# Patient Record
Sex: Female | Born: 1970 | Race: White | Hispanic: No | Marital: Married | Smoking: Never smoker
Health system: Southern US, Community
[De-identification: ages and names within clinical notes are randomized; demographics above are authoritative.]

## PROBLEM LIST (undated history)

## (undated) HISTORY — PX: HAND SURGERY: SHX662

---

## 2000-03-29 ENCOUNTER — Ambulatory Visit (HOSPITAL_COMMUNITY): Admission: RE | Admit: 2000-03-29 | Discharge: 2000-03-29 | Payer: Self-pay | Admitting: Obstetrics and Gynecology

## 2000-03-29 ENCOUNTER — Encounter: Payer: Self-pay | Admitting: Obstetrics and Gynecology

## 2002-04-25 ENCOUNTER — Other Ambulatory Visit: Admission: RE | Admit: 2002-04-25 | Discharge: 2002-04-25 | Payer: Self-pay | Admitting: Obstetrics and Gynecology

## 2003-07-04 ENCOUNTER — Other Ambulatory Visit: Admission: RE | Admit: 2003-07-04 | Discharge: 2003-07-04 | Payer: Self-pay | Admitting: Obstetrics and Gynecology

## 2004-07-07 ENCOUNTER — Other Ambulatory Visit: Admission: RE | Admit: 2004-07-07 | Discharge: 2004-07-07 | Payer: Self-pay | Admitting: Obstetrics and Gynecology

## 2004-12-24 ENCOUNTER — Other Ambulatory Visit: Admission: RE | Admit: 2004-12-24 | Discharge: 2004-12-24 | Payer: Self-pay | Admitting: Obstetrics and Gynecology

## 2006-02-28 ENCOUNTER — Other Ambulatory Visit: Admission: RE | Admit: 2006-02-28 | Discharge: 2006-02-28 | Payer: Self-pay | Admitting: Obstetrics and Gynecology

## 2006-08-09 ENCOUNTER — Emergency Department (HOSPITAL_COMMUNITY): Admission: EM | Admit: 2006-08-09 | Discharge: 2006-08-09 | Payer: Self-pay | Admitting: Family Medicine

## 2010-09-13 ENCOUNTER — Emergency Department (HOSPITAL_COMMUNITY): Payer: No Typology Code available for payment source

## 2010-09-13 ENCOUNTER — Emergency Department (HOSPITAL_COMMUNITY)
Admission: EM | Admit: 2010-09-13 | Discharge: 2010-09-13 | Disposition: A | Discharge status: Home / Self Care | Payer: No Typology Code available for payment source | Attending: Emergency Medicine | Admitting: Emergency Medicine

## 2010-09-13 DIAGNOSIS — M25519 Pain in unspecified shoulder: Secondary | ICD-10-CM | POA: Insufficient documentation

## 2010-09-13 DIAGNOSIS — IMO0002 Reserved for concepts with insufficient information to code with codable children: Secondary | ICD-10-CM | POA: Insufficient documentation

## 2010-09-13 LAB — URINALYSIS, ROUTINE W REFLEX MICROSCOPIC
Bilirubin Urine: NEGATIVE
Glucose, UA: NEGATIVE mg/dL
Ketones, ur: NEGATIVE mg/dL
Leukocytes, UA: NEGATIVE
Nitrite: NEGATIVE
Protein, ur: 30 mg/dL — AB
Specific Gravity, Urine: 1.023 (ref 1.005–1.030)
Urobilinogen, UA: 0.2 mg/dL (ref 0.0–1.0)
pH: 5.5 (ref 5.0–8.0)

## 2010-09-13 LAB — URINE MICROSCOPIC-ADD ON

## 2010-09-24 ENCOUNTER — Emergency Department (HOSPITAL_COMMUNITY)
Admission: EM | Admit: 2010-09-24 | Discharge: 2010-09-25 | Disposition: A | Discharge status: Home / Self Care | Payer: Self-pay | Source: Home / Self Care | Attending: Emergency Medicine | Admitting: Emergency Medicine

## 2010-09-24 DIAGNOSIS — Y92009 Unspecified place in unspecified non-institutional (private) residence as the place of occurrence of the external cause: Secondary | ICD-10-CM | POA: Insufficient documentation

## 2010-09-24 DIAGNOSIS — W540XXA Bitten by dog, initial encounter: Secondary | ICD-10-CM | POA: Insufficient documentation

## 2010-09-24 DIAGNOSIS — L02519 Cutaneous abscess of unspecified hand: Secondary | ICD-10-CM | POA: Insufficient documentation

## 2010-09-24 DIAGNOSIS — Y998 Other external cause status: Secondary | ICD-10-CM | POA: Insufficient documentation

## 2010-09-24 DIAGNOSIS — Z79899 Other long term (current) drug therapy: Secondary | ICD-10-CM | POA: Insufficient documentation

## 2010-09-24 DIAGNOSIS — L03019 Cellulitis of unspecified finger: Secondary | ICD-10-CM | POA: Insufficient documentation

## 2010-09-24 DIAGNOSIS — S61209A Unspecified open wound of unspecified finger without damage to nail, initial encounter: Secondary | ICD-10-CM | POA: Insufficient documentation

## 2010-09-24 LAB — DIFFERENTIAL
Basophils Relative: 0 % (ref 0–1)
Lymphocytes Relative: 29 % (ref 12–46)
Monocytes Absolute: 0.9 10*3/uL (ref 0.1–1.0)
Monocytes Relative: 7 % (ref 3–12)
Neutro Abs: 7.4 10*3/uL (ref 1.7–7.7)

## 2010-09-24 LAB — CBC
HCT: 41.5 % (ref 36.0–46.0)
Hemoglobin: 13.5 g/dL (ref 12.0–15.0)
MCH: 29.2 pg (ref 26.0–34.0)
MCHC: 32.5 g/dL (ref 30.0–36.0)
MCV: 89.8 fL (ref 78.0–100.0)

## 2010-09-25 ENCOUNTER — Inpatient Hospital Stay (HOSPITAL_COMMUNITY)
Admission: EM | Admit: 2010-09-25 | Discharge: 2010-09-27 | DRG: 506 | Disposition: A | Discharge status: Home / Self Care | Payer: No Typology Code available for payment source | Source: Ambulatory Visit | Attending: Orthopedic Surgery | Admitting: Orthopedic Surgery

## 2010-09-25 ENCOUNTER — Emergency Department (HOSPITAL_COMMUNITY): Payer: No Typology Code available for payment source

## 2010-09-25 DIAGNOSIS — W540XXA Bitten by dog, initial encounter: Secondary | ICD-10-CM | POA: Diagnosis present

## 2010-09-25 DIAGNOSIS — Z79899 Other long term (current) drug therapy: Secondary | ICD-10-CM

## 2010-09-25 DIAGNOSIS — M65839 Other synovitis and tenosynovitis, unspecified forearm: Principal | ICD-10-CM | POA: Diagnosis present

## 2010-09-25 DIAGNOSIS — S61209A Unspecified open wound of unspecified finger without damage to nail, initial encounter: Secondary | ICD-10-CM | POA: Diagnosis present

## 2010-09-25 LAB — GRAM STAIN

## 2010-09-26 LAB — CBC
MCHC: 32.4 g/dL (ref 30.0–36.0)
MCV: 88.9 fL (ref 78.0–100.0)
Platelets: 285 10*3/uL (ref 150–400)
RDW: 13.6 % (ref 11.5–15.5)
WBC: 15.2 10*3/uL — ABNORMAL HIGH (ref 4.0–10.5)

## 2010-09-26 LAB — BASIC METABOLIC PANEL
BUN: 10 mg/dL (ref 6–23)
Chloride: 107 mEq/L (ref 96–112)
Creatinine, Ser: 0.53 mg/dL (ref 0.4–1.2)
GFR calc non Af Amer: >60 mL/min (ref >60)
Glucose, Bld: 145 mg/dL — ABNORMAL HIGH (ref 70–99)

## 2010-09-27 LAB — CBC
HCT: 35.8 % — ABNORMAL LOW (ref 36.0–46.0)
MCHC: 31.8 g/dL (ref 30.0–36.0)
Platelets: 280 10*3/uL (ref 150–400)
RDW: 14.1 % (ref 11.5–15.5)
WBC: 11.5 10*3/uL — ABNORMAL HIGH (ref 4.0–10.5)

## 2010-09-28 LAB — CULTURE, ROUTINE-ABSCESS: Gram Stain: NONE SEEN

## 2010-09-30 LAB — ANAEROBIC CULTURE
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
## 2010-10-08 NOTE — Op Note (Signed)
NAME:  Nancy Mayo, Nancy Mayo NO.:  [id number]  MEDICAL RECORD NO.:  [id number]           PATIENT TYPE:  E  LOCATION:  [facility]                         FACILITY:  [facility]  PHYSICIAN:  Betha Loa, MD        DATE OF BIRTH:  [birth year]  DATE OF PROCEDURE:  09/25/2010 DATE OF DISCHARGE:                              OPERATIVE REPORT   PREOPERATIVE DIAGNOSES:  Left index finger infected dog bites and flexor tenosynovitis.  POSTOPERATIVE DIAGNOSIS:  Left index finger infected dog bites and flexor tenosynovitis.  PROCEDURE:  Irrigation and debridement of left index finger dog bite, flexor tendon sheath and distal interphalangeal joint.  SURGEON:  Betha Loa, MD  ASSISTANT:  None.  ANESTHESIA:  General.  INTRAVENOUS FLUIDS:  Per anesthesia flow sheet.  ESTIMATED BLOOD LOSS:  Minimal.  COMPLICATIONS:  None.  SPECIMENS:  Cultures from bite wounds and flexor tendon sheath.  TOURNIQUET TIME:  59 minutes.  DISPOSITION:  Stable to PACU.  INDICATIONS:  Ms. Blake is a 40 year old right-hand-dominant white female who was bitten by her own dog a day and half ago.  She noted increased swelling, pain, and erythema of the left index finger.  She went to the [facility] Emergency Department where she was evaluated. She was felt to have an infection of the index finger.  She was transferred to [facility] for my further care.  She was given a dose of IV clindamycin at [facility].  Her tetanus was updated at [facility].  On evaluation, she had a swollen and tender left index finger with tenderness volarly and pain with passive extension.  Recommended to Ms. Falkner going to the operating room for irrigation and debridement of bite wounds in the flexor tendon sheath.  Risks, benefits, and alternatives of surgery were discussed including the risks of blood loss, infection, damage to nerves, vessels, tendons, ligaments, and bone, failure of surgery, need for additional surgery,  complications with wound healing, continued pain, and potential need for repeat irrigation and debridement.  She voiced understanding these risks and elected to proceed.  OPERATIVE COURSE:  After being identified preoperatively by myself, the patient and I agreed upon the procedure and site of procedure.  Surgical site was marked.  Risks, benefits, and alternatives of surgery were reviewed and she wished to proceed.  Surgical consent had been signed. She was transferred to the operating room and placed on the operating table in supine position with left upper extremity on an arm board. Anesthesia was induced by the anesthesiologist.  The left upper extremity was prepped and draped in the normal sterile orthopedic fashion.  A surgical pause was performed between surgeons, Anesthesia, and operating staff and all were in agreement as to the patient, procedure, and site of procedure.  Tourniquet on the proximal aspect of the extremity was inflated to 250 mmHg after exsanguination of limb by gravity.  Incisions were made at the bite wounds on the radial and ulnar sides of the proximal phalanx of the index finger.  On the radial side, there were 2 bite wounds that were connected.  There was purulence expressed.  Cultures were  taken and sent to Micro for evaluation.  The wound on the ulnar side was extended.  It was able to be spread open into the subcutaneous tissues of the volar aspect of the proximal phalanx.  An incision was made in the palm at the level of the A1 pulley.  The A1 pulley was incised.  There was purulence within the flexor tendon sheath.  This was sent for cultures as well.  An incision was made over the distal phalanx volarly.  A #5 pediatric feeding tube was able to be threaded into the flexor tendon sheath from distally. 250 mL of sterile saline were irrigated through the flexor tendon sheath by the pediatric feeding tube.  Good effluent was obtained from both proximal  and distal wounds.  An incision was made over the bite wound at the level of the DIP joint on the radial side of the finger.  This was carried into the subcutaneous tissues.  The DIP joint was entered. There was noted to be no purulent fluid within it.  The joint was irrigated.  A vessel loop drain was placed in the DIP joint.  All wounds were then packed with 0.25-inch iodoform packing.  The wounds had all then copiously irrigated with 1000 mL of sterile saline prior to packing.  The wounds were then dressed with sterile Xeroform and 4 x 4's and wrapped with a Kerlix bandage.  A dorsal blocking splint was placed for the patient's comfort.  This was wrapped with Kerlix and Ace bandage.  Tourniquet was deflated at 59 minutes.  All fingertips were pink with brisk capillary refill after deflation of the third.  The operative drapes were broken down and the patient was awoken from anesthesia safely.  She was transferred back to the stretcher and taken to PACU in stable condition.  We will keep her admitted for IV antibiotics.  She got a dose of IV Avelox 400 mg during the operation. We will continue her on this Avelox 400 mg IV daily as well as Flagyl 500 mg p.o. q.8 h.  As her white count and erythema decrease, we will be able to discharge her home and follow up in the office for whirlpool therapy.     Betha Loa, MD     KK/MEDQ  D:  09/25/2010  T:  09/25/2010  Job:  [id number]  Electronically Signed by Betha Loa  on 10/08/2010 10:49:29 PM

## 2010-10-08 NOTE — Discharge Summary (Signed)
  NAME:  Nancy Mayo, BATTLE NO.:  [id number]  MEDICAL RECORD NO.:  [id number]           PATIENT TYPE:  O  LOCATION:  5028                         FACILITY:  [facility]  PHYSICIAN:  Betha Loa, MD        DATE OF BIRTH:  04/12/1971  DATE OF ADMISSION:  09/25/2010 DATE OF DISCHARGE:  09/27/2010                              DISCHARGE SUMMARY   FINAL DIAGNOSIS:  Left index finger flexor tenosynovitis and subcutaneous abscess.  PROCEDURE:  Left index finger irrigation and debridement flexor tendon sheath, distal interphalangeal joint and subcutaneous tissues.  HISTORY:  [name] is a 40 year old right-hand dominant white female was bitten by her own dog day and half prior to admission.  She had increased swelling, pain, and erythema in the left index finger.  She went to [facility] emergency department where she was evaluated.  She was found to have an infection in the index finger.  She was transferred to [facility] for further care.  She was given a dose of IV clindamycin at [facility].  Tetanus was updated at [facility].  On evaluation, she has swollen and tender left index finger with tenderness volarly and pain with passive extension.  I recommended to Ms. Dimiceli going to the operating room for irrigation and debridement of the left index finger. Risks, benefits and alternatives of surgery were discussed including risk of blood loss, infection, damage to nerves, vessels, tendons, ligaments, bone, failure of procedure, need for additional procedures, complications with wound healing, continued pain, continued infection and stiffness of the finger.  She wished understanding of these risks and elected to proceed.  Her white count was checked and it was 12.1.  HOSPITAL COURSE:  [name] was admitted to hospital early morning hours of Sep 25, 2010.  She was taken to the operating room for irrigation and debridement of left index finger flexor tendon sheath, DIP  joint and subcutaneous tissues of the proximal phalanx.  This was performed without complication.  She was started on IV Avelox and oral Flagyl as she was penicillin and amoxicillin allergic.  She was admitted to the hospital for continued antibiotics.  Followup her white blood count initially went up to 15.2 the first day following her procedure, but then was trending down and was 11.5 on Sep 29, 2010.  The erythema and swelling in the hand had nearly resolved and the tenderness had significantly decreased.  Her drains were removed and was felt to be safe to go home on oral antibiotics.  I will follow up with her this week for continued postoperative care and hydrotherapy with packing changes.  MEDICATIONS ON DISCHARGE: 1. Avelox 400 mg p.o. daily. 2. Flagyl 500 mg p.o. q.8 hours. 3. Percocet 5/325 one to two p.o. q.6 h. p.r.n. pain dispensed #50. 4. Zofran 4 mg p.o. q.8 h. p.r.n. nausea, dispensed #20.     Betha Loa, MD     KK/MEDQ  D:  09/27/2010  T:  09/28/2010  Job:  [id number]  Electronically Signed by Betha Loa  on 10/08/2010 10:46:34 PM

## 2010-11-07 LAB — AFB CULTURE WITH SMEAR (NOT AT ARMC): Acid Fast Smear: NONE SEEN
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
## 2012-04-30 IMAGING — CR DG HAND COMPLETE 3+V*L*
3 series · 3 of 3 positions shown · non-contrast
Comparison: None

CLINICAL DATA: Dog bite to the second digit with pain, redness, and
swelling.

LEFT HAND - COMPLETE 3+ VIEW

[view not recorded (1 of 3)]
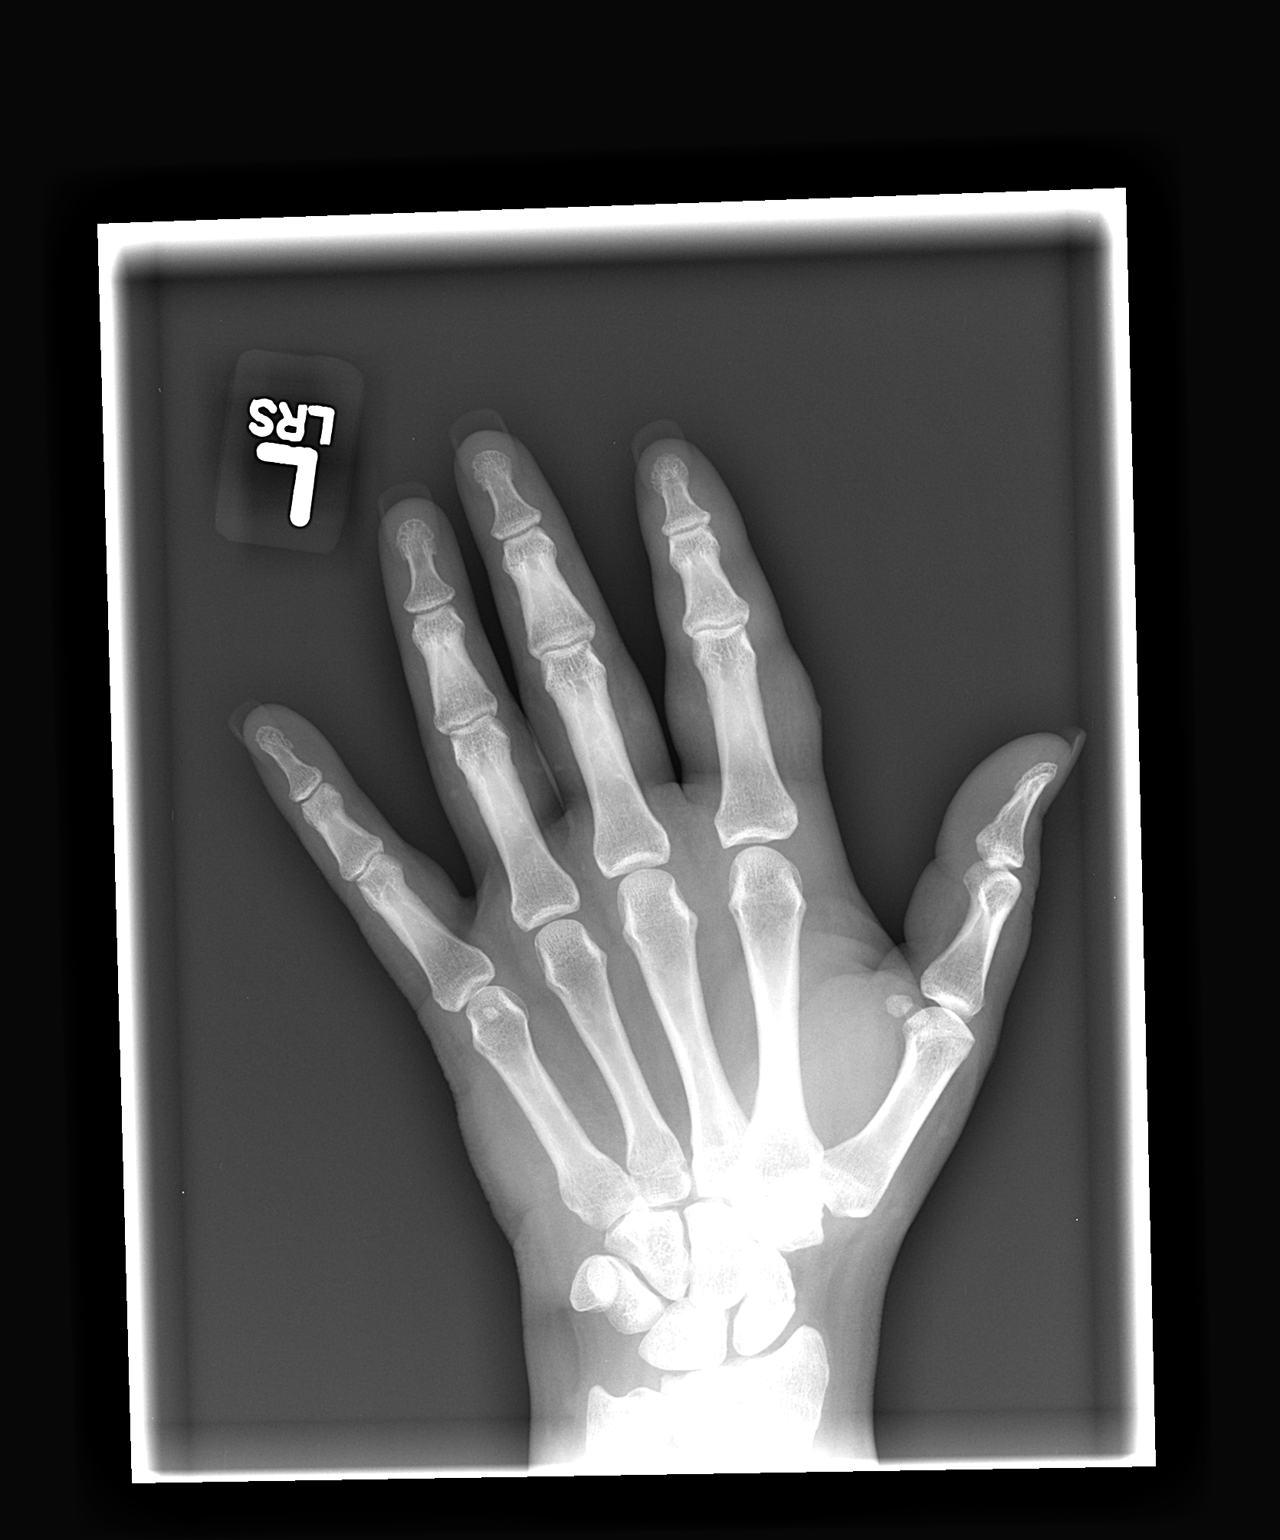

[view not recorded (2 of 3)]
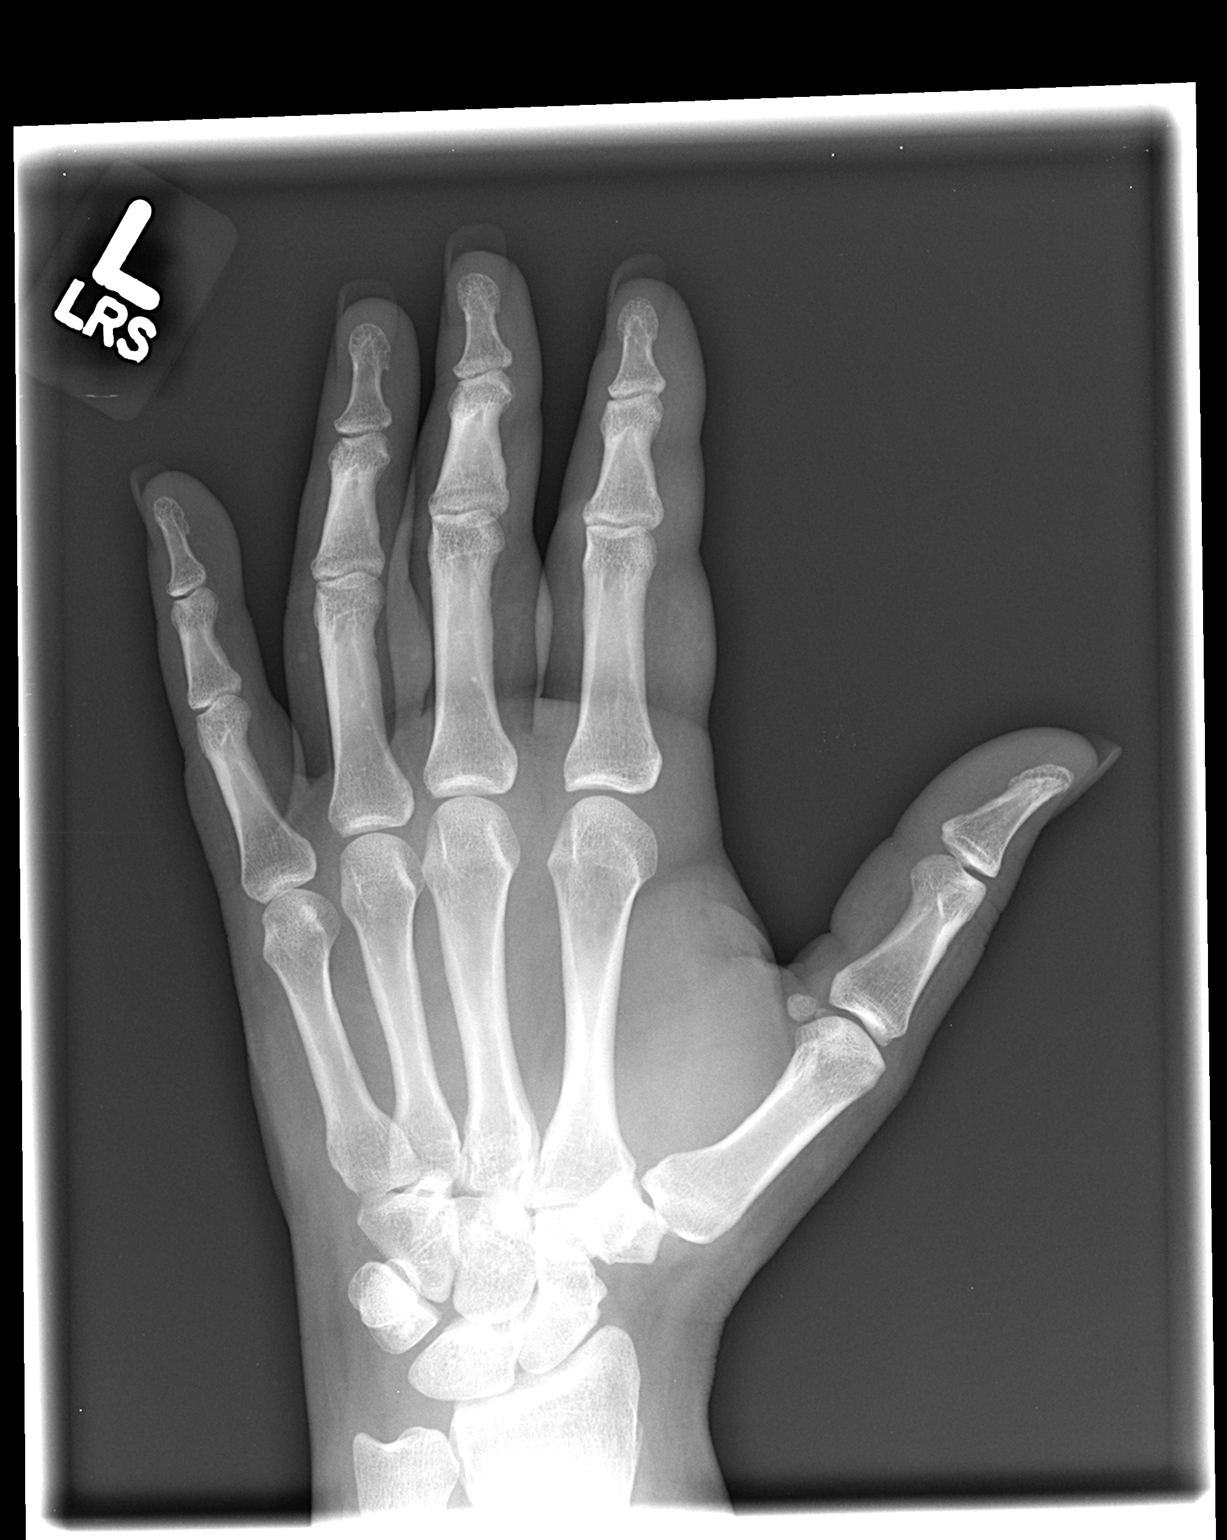

[view not recorded (3 of 3)]
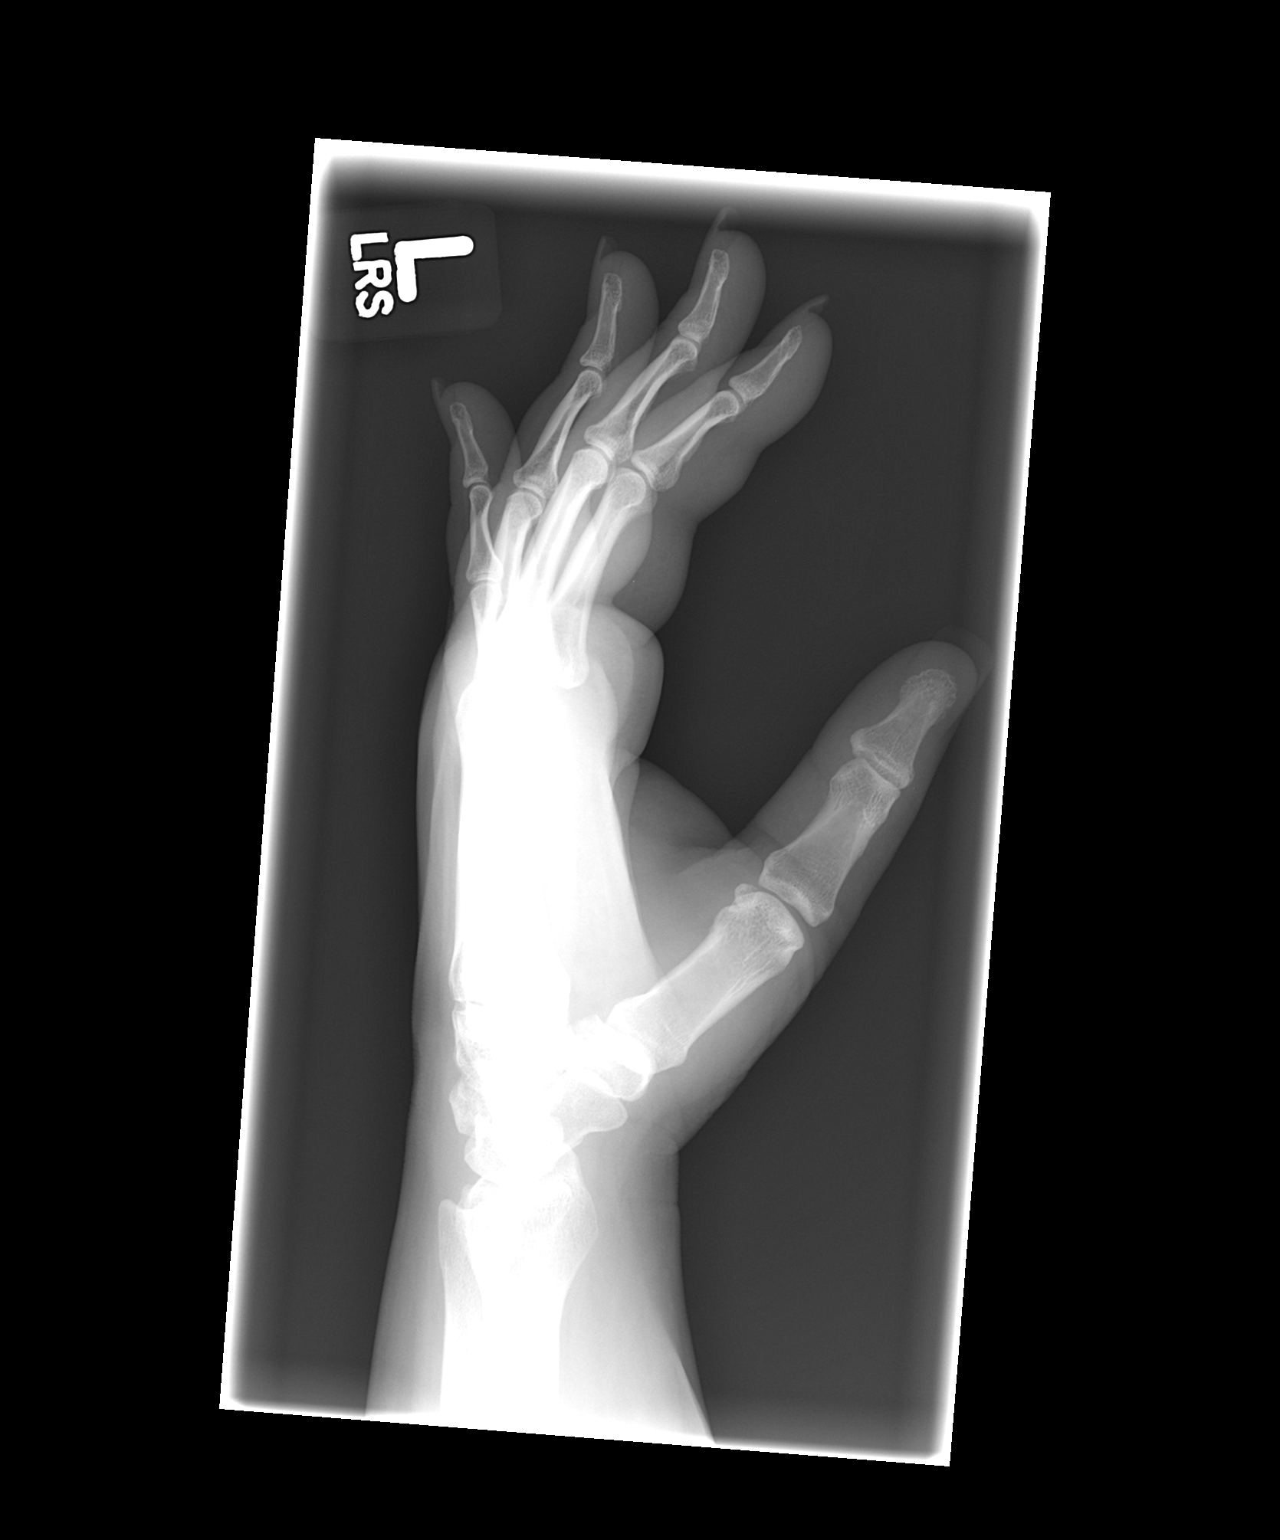

[3 of 3 positions shown; findings below may reference images not displayed]

FINDINGS: Next fever there is some soft tissue swelling over the
proximal aspect of the left second finger.  Suggestion of small
skin defect laterally.  No radiopaque foreign bodies are
demonstrated in the soft tissues.  The underlying bones appear
intact.  No evidence of fracture, sclerosis, periosteal reaction,
or erosion.
IMPRESSION: Soft tissue swelling.  No radiopaque foreign bodies.  No acute bony
abnormalities.

## 2013-02-16 ENCOUNTER — Other Ambulatory Visit: Payer: Self-pay

## 2013-02-16 DIAGNOSIS — Z1231 Encounter for screening mammogram for malignant neoplasm of breast: Secondary | ICD-10-CM

## 2013-02-21 ENCOUNTER — Ambulatory Visit: Payer: Self-pay

## 2013-02-26 ENCOUNTER — Ambulatory Visit
Admission: RE | Admit: 2013-02-26 | Discharge: 2013-02-26 | Disposition: A | Discharge status: Home / Self Care | Payer: Self-pay | Source: Ambulatory Visit

## 2013-02-26 DIAGNOSIS — Z1231 Encounter for screening mammogram for malignant neoplasm of breast: Secondary | ICD-10-CM

## 2014-09-24 ENCOUNTER — Other Ambulatory Visit: Payer: Self-pay | Admitting: *Deleted

## 2014-09-24 DIAGNOSIS — M792 Neuralgia and neuritis, unspecified: Secondary | ICD-10-CM

## 2014-10-10 ENCOUNTER — Ambulatory Visit (INDEPENDENT_AMBULATORY_CARE_PROVIDER_SITE_OTHER): Payer: 59 | Admitting: Neurology

## 2014-10-10 DIAGNOSIS — M792 Neuralgia and neuritis, unspecified: Secondary | ICD-10-CM | POA: Diagnosis not present

## 2014-10-10 DIAGNOSIS — G5601 Carpal tunnel syndrome, right upper limb: Secondary | ICD-10-CM

## 2014-10-10 DIAGNOSIS — G5621 Lesion of ulnar nerve, right upper limb: Secondary | ICD-10-CM

## 2014-10-10 NOTE — Procedures (Signed)
[facility]  [address], [address]  [place], [place] [address] Tel: [phone] Fax:  [phone] Test Date:  10/10/2014  Patient: Nancy PouLaura Mayo DOB: 08/10/1970 Physician: Nita Sickleonika Patel  Sex: Female Height: 5\' 6"  Ref Phys: Lupe CarneyMitchell, Dean  ID#: [id number] Temp: 38.0C Technician: Ala BentSusan Reid R. NCS T.   Patient Complaints: Patient is a 44 year old female here for evaluation of her right upper extremity due to numbness and tingling in digits 4 and 5.  NCV & EMG Findings: Extensive electrodiagnostic testing of the right upper extremity shows:  1. Right median sensory response is prolonged (3.8 ms) with preserved amplitude. Right ulnar, radial, and dorsal ulnar cutaneous sensory responses are within normal limits. 2. Right ulnar motor response (FDI) shows normal amplitude and latency, however there is absolute conduction velocity slowing across the elbow. Right median motor responses within normal limits. 3. Sparse active and chronic motor axon loss changes isolated to the first dorsal interosseous muscle. The remaining tested muscles showed normal motor unit configuration and recruitment pattern.  Impression: 1. 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 Overall, these findings are mild in degree electrically. 2. Right median neuropathy at or distal to the wrist, consistent with the clinical diagnosis of carpal tunnel syndrome; mild in degree electrically.    ___________________________ Nita Sickleonika Patel    Nerve Conduction Studies Anti Sensory Summary Table   Stim Site NR Peak (ms) Norm Peak (ms) P-T Amp (V) Norm P-T Amp  Right DorsCutan Anti Sensory (Dorsum 5th MC)  38C  Wrist    1.9 <3.1 18.3 >12  Right Median Anti Sensory (2nd Digit)  38C  Wrist    3.8 <3.4 27.2 >20  Right Radial Anti Sensory (Base 1st Digit)  38C  Wrist    1.9 <2.7 40.3 >18  Right Ulnar Anti Sensory (5th Digit)  38C  Wrist    2.8 <3.1 19.6 >12   Motor  Summary Table   Stim Site NR Onset (ms) Norm Onset (ms) O-P Amp (mV) Norm O-P Amp Site1 Site2 Delta-0 (ms) Dist (cm) Vel (m/s) Norm Vel (m/s)  Right Median Motor (Abd Poll Brev)  38C  Wrist    3.8 <3.9 9.5 >6 Elbow Wrist 5.1 29.0 57 >50  Elbow    8.9  8.9         Right Ulnar Motor (Abd Dig Minimi)  38C  Wrist    2.0 <3.1 9.5 >7 B Elbow Wrist 3.8 24.0 63 >50  B Elbow    5.8  8.9  A Elbow B Elbow 2.0 10.0 50 >50  A Elbow    7.8  7.8         Right Ulnar (FDI) Motor (1st DI)  38C  Wrist    3.5 <4.3 11.1 >7         EMG   Side Muscle Ins Act Fibs Psw Fasc Number Recrt Dur Dur. Amp Amp. Poly Poly. Comment  Right 1stDorInt Nml 1+ Nml Nml 1- Mod-R Nml Nml Nml Nml Nml Nml N/A  Right ABD Dig Min Nml Nml Nml Nml Nml Nml Nml Nml Nml Nml Nml Nml N/A  Right FlexDigProf 4,5 Nml Nml Nml Nml Nml Nml Nml Nml Nml Nml Nml Nml N/A  Right Ext Indicis Nml Nml Nml Nml Nml Nml Nml Nml Nml Nml Nml Nml N/A  Right PronatorTeres Nml Nml Nml Nml Nml Nml Nml Nml Nml Nml Nml Nml N/A  Right Biceps Nml Nml Nml Nml Nml Nml Nml  Nml Nml Nml Nml Nml N/A  Right Triceps Nml Nml Nml Nml Nml Nml Nml Nml Nml Nml Nml Nml N/A  Right Deltoid Nml Nml Nml Nml Nml Nml Nml Nml Nml Nml Nml Nml N/A      Waveforms:

## 2015-07-17 ENCOUNTER — Encounter (HOSPITAL_COMMUNITY): Payer: Self-pay

## 2015-07-17 ENCOUNTER — Emergency Department (HOSPITAL_COMMUNITY)
Admission: EM | Admit: 2015-07-17 | Discharge: 2015-07-17 | Disposition: A | Discharge status: Nursing Home (Includes ICF) | Payer: BLUE CROSS/BLUE SHIELD | Source: Home / Self Care | Attending: Family Medicine | Admitting: Family Medicine

## 2015-07-17 ENCOUNTER — Emergency Department (HOSPITAL_COMMUNITY)
Admission: EM | Admit: 2015-07-17 | Discharge: 2015-07-17 | Disposition: A | Discharge status: Home / Self Care | Payer: BLUE CROSS/BLUE SHIELD | Attending: Emergency Medicine | Admitting: Emergency Medicine

## 2015-07-17 ENCOUNTER — Emergency Department (HOSPITAL_COMMUNITY): Payer: BLUE CROSS/BLUE SHIELD

## 2015-07-17 ENCOUNTER — Encounter (HOSPITAL_COMMUNITY): Payer: Self-pay | Admitting: *Deleted

## 2015-07-17 DIAGNOSIS — Z3202 Encounter for pregnancy test, result negative: Secondary | ICD-10-CM | POA: Insufficient documentation

## 2015-07-17 DIAGNOSIS — R1011 Right upper quadrant pain: Secondary | ICD-10-CM | POA: Insufficient documentation

## 2015-07-17 DIAGNOSIS — E669 Obesity, unspecified: Secondary | ICD-10-CM | POA: Insufficient documentation

## 2015-07-17 DIAGNOSIS — Z880 Allergy status to penicillin: Secondary | ICD-10-CM | POA: Insufficient documentation

## 2015-07-17 LAB — COMPREHENSIVE METABOLIC PANEL
ALK PHOS: 58 U/L (ref 38–126)
ALT: 35 U/L (ref 14–54)
AST: 36 U/L (ref 15–41)
Albumin: 3.3 g/dL — ABNORMAL LOW (ref 3.5–5.0)
Anion gap: 12 (ref 5–15)
BUN: 11 mg/dL (ref 6–20)
CALCIUM: 9.3 mg/dL (ref 8.9–10.3)
CO2: 20 mmol/L — ABNORMAL LOW (ref 22–32)
CREATININE: 0.72 mg/dL (ref 0.44–1.00)
Chloride: 108 mmol/L (ref 101–111)
Glucose, Bld: 111 mg/dL — ABNORMAL HIGH (ref 65–99)
Potassium: 4 mmol/L (ref 3.5–5.1)
Sodium: 140 mmol/L (ref 135–145)
Total Bilirubin: 0.5 mg/dL (ref 0.3–1.2)
Total Protein: 7 g/dL (ref 6.5–8.1)

## 2015-07-17 LAB — URINE MICROSCOPIC-ADD ON

## 2015-07-17 LAB — URINALYSIS, ROUTINE W REFLEX MICROSCOPIC
Bilirubin Urine: NEGATIVE
Glucose, UA: NEGATIVE mg/dL
Ketones, ur: NEGATIVE mg/dL
LEUKOCYTES UA: NEGATIVE
Nitrite: NEGATIVE
PROTEIN: NEGATIVE mg/dL
Specific Gravity, Urine: 1.026 (ref 1.005–1.030)
pH: 5.5 (ref 5.0–8.0)

## 2015-07-17 LAB — CBC
HCT: 44.2 % (ref 36.0–46.0)
Hemoglobin: 14.4 g/dL (ref 12.0–15.0)
MCH: 28.7 pg (ref 26.0–34.0)
MCHC: 32.6 g/dL (ref 30.0–36.0)
MCV: 88 fL (ref 78.0–100.0)
PLATELETS: 294 10*3/uL (ref 150–400)
RBC: 5.02 MIL/uL (ref 3.87–5.11)
RDW: 13.5 % (ref 11.5–15.5)
WBC: 8.8 10*3/uL (ref 4.0–10.5)

## 2015-07-17 LAB — I-STAT BETA HCG BLOOD, ED (MC, WL, AP ONLY): I-stat hCG, quantitative: <5 m[IU]/mL (ref <5)

## 2015-07-17 LAB — LIPASE, BLOOD: Lipase: 28 U/L (ref 11–51)

## 2015-07-17 MED ORDER — HYDROCODONE-ACETAMINOPHEN 5-325 MG PO TABS: 1.0000 | ORAL_TABLET | Freq: Four times a day (QID) | ORAL | Status: AC | PRN

## 2015-07-17 MED ORDER — ONDANSETRON HCL 4 MG/2ML IJ SOLN
4.0000 mg | Freq: Once | INTRAMUSCULAR | Status: AC
  Administered 2015-07-17: 4 mg via INTRAVENOUS
  Filled 2015-07-17: qty 2

## 2015-07-17 MED ORDER — ONDANSETRON 4 MG PO TBDP
ORAL_TABLET | ORAL | Status: AC
Start: 2015-07-17 — End: 2015-07-17
  Filled 2015-07-17: qty 1

## 2015-07-17 MED ORDER — HYDROMORPHONE HCL 1 MG/ML IJ SOLN
2.0000 mg | Freq: Once | INTRAMUSCULAR | Status: AC
  Administered 2015-07-17: 2 mg via INTRAMUSCULAR

## 2015-07-17 MED ORDER — ONDANSETRON 4 MG PO TBDP
ORAL_TABLET | ORAL | Status: AC
  Filled 2015-07-17: qty 1

## 2015-07-17 MED ORDER — HYDROMORPHONE HCL 1 MG/ML IJ SOLN
INTRAMUSCULAR | Status: AC
  Filled 2015-07-17: qty 2

## 2015-07-17 MED ORDER — SODIUM CHLORIDE 0.9 % IV BOLUS (SEPSIS)
1000.0000 mL | Freq: Once | INTRAVENOUS | Status: AC
  Administered 2015-07-17: 1000 mL via INTRAVENOUS

## 2015-07-17 MED ORDER — HYDROMORPHONE HCL 1 MG/ML IJ SOLN
0.5000 mg | Freq: Once | INTRAMUSCULAR | Status: AC
  Administered 2015-07-17: 0.5 mg via INTRAVENOUS
  Filled 2015-07-17: qty 1

## 2015-07-17 MED ORDER — ONDANSETRON HCL 4 MG PO TABS: 4.0000 mg | ORAL_TABLET | Freq: Four times a day (QID) | ORAL | Status: AC

## 2015-07-17 MED ORDER — ONDANSETRON 4 MG PO TBDP
8.0000 mg | ORAL_TABLET | Freq: Once | ORAL | Status: AC
  Administered 2015-07-17: 8 mg via ORAL

## 2015-07-17 NOTE — ED Notes (Signed)
Patient transported to Ultrasound 

## 2015-07-17 NOTE — ED Provider Notes (Signed)
CSN: [id number]     Arrival date & time 07/17/15  1301 History   First MD Initiated Contact with Patient 07/17/15 1325     Chief Complaint  Patient presents with  . Abdominal Pain   (Consider location/radiation/quality/duration/timing/severity/associated sxs/prior Treatment) Patient is a 45 y.o. female presenting with abdominal pain. The history is provided by the patient.  Abdominal Pain Pain location:  RUQ Pain quality: cramping and squeezing   Pain radiates to:  Does not radiate Pain severity:  Moderate Onset quality:  Sudden Duration:  1 day Progression:  Waxing and waning Chronicity:  New Context comment:  Sl diarrhea with lighter colored stool. Associated symptoms: anorexia, diarrhea and nausea   Associated symptoms: no dysuria, no fever, no vaginal bleeding, no vaginal discharge and no vomiting   Risk factors: obesity     History reviewed. No pertinent past medical history. History reviewed. No pertinent past surgical history. History reviewed. No pertinent family history. Social History  Substance Use Topics  . Smoking status: Never Smoker   . Smokeless tobacco: None  . Alcohol Use: Yes   OB History    No data available     Review of Systems  Constitutional: Negative for fever.  HENT: Negative.   Respiratory: Negative.   Cardiovascular: Negative.   Gastrointestinal: Positive for nausea, abdominal pain, diarrhea and anorexia. Negative for vomiting and blood in stool.  Genitourinary: Negative.  Negative for dysuria, vaginal bleeding and vaginal discharge.  All other systems reviewed and are negative.   Allergies  Review of patient's allergies indicates not on file.  Home Medications   Prior to Admission medications   Not on File   Meds Ordered and Administered this Visit  Medications - No data to display  BP 148/101 mmHg  Pulse 115  Temp(Src) 98.5 F (36.9 C) (Oral)  Resp 16  SpO2 97% No data found.   Physical Exam  Constitutional: She is  oriented to person, place, and time. She appears well-developed and well-nourished. She appears distressed.  Neck: Normal range of motion. Neck supple.  Cardiovascular: Normal rate, normal heart sounds and intact distal pulses.   Pulmonary/Chest: Effort normal and breath sounds normal.  Abdominal: Soft. Normal appearance and bowel sounds are normal. She exhibits no distension and no mass. There is no hepatosplenomegaly. There is tenderness in the right upper quadrant. There is guarding and positive Murphy's sign. There is no rigidity, no rebound and no CVA tenderness.    Lymphadenopathy:    She has no cervical adenopathy.  Neurological: She is alert and oriented to person, place, and time.  Skin: Skin is warm and dry.  Nursing note and vitals reviewed.   ED Course  Procedures (including critical care time)  Labs Review Labs Reviewed - No data to display  Imaging Review No results found.   Visual Acuity Review  Right Eye Distance:   Left Eye Distance:   Bilateral Distance:    Right Eye Near:   Left Eye Near:    Bilateral Near:         MDM   1. Abdominal pain, RUQ (right upper quadrant)    Sent for eval of acute ruq pain c/w acute chole.    Linna Hoff, MD 07/17/15 [phone]

## 2015-07-17 NOTE — ED Notes (Signed)
Gave pt Ginger Ale, per [place] - RN.

## 2015-07-17 NOTE — ED Notes (Signed)
RN called [place] to inquire about length of time before study per patient request; [place] staff reports they are sending for her soon

## 2015-07-17 NOTE — ED Provider Notes (Signed)
CSN: [id number]     Arrival date & time 07/17/15  1438 History   First MD Initiated Contact with Patient 07/17/15 1715     Chief Complaint  Patient presents with  . Abdominal Pain   HPI   Nancy Mayo is a 45 y.o. female with no significant PMH presenting with a 1 day history of epigastric and right upper quadrant pain. She describes her pain as intermittent, sharp, nonradiating, 10 out of 10 pain scale, not associated with eating. She endorses nausea and diarrhea with an appearance of mucous since this morning. She denies fevers, chills, emesis, CP, SOB, urinary or vaginal complaints, ill contacts, recent abx use, hematochezia or dark stools.   History reviewed. No pertinent past medical history. Past Surgical History  Procedure Laterality Date  . Hand surgery Left     dog bite repair   No family history on file. Social History  Substance Use Topics  . Smoking status: Never Smoker   . Smokeless tobacco: None  . Alcohol Use: Yes     Comment: occassionally   OB History    No data available     Review of Systems  Ten systems are reviewed and are negative for acute change except as noted in the HPI  Allergies  Amoxicillin and Penicillins  Home Medications   Prior to Admission medications   Not on File   BP 137/98 mmHg  Pulse 118  Temp(Src) 98.5 F (36.9 C) (Oral)  Resp 20  Ht  (1.676 m)  Wt 126.1 kg  BMI 44.89 kg/m2  SpO2 97% Physical Exam  Constitutional: She appears well-developed and well-nourished. No distress.  Obese  HENT:  Head: Normocephalic and atraumatic.  Mouth/Throat: Oropharynx is clear and moist. No oropharyngeal exudate.  Eyes: Conjunctivae are normal. Pupils are equal, round, and reactive to light. Right eye exhibits no discharge. Left eye exhibits no discharge. No scleral icterus.  Neck: No tracheal deviation present.  Cardiovascular: Normal rate, regular rhythm, normal heart sounds and intact distal pulses.  Exam reveals no gallop and no  friction rub.   No murmur heard. Pulmonary/Chest: Effort normal and breath sounds normal. No respiratory distress. She has no wheezes. She has no rales. She exhibits no tenderness.  Abdominal: Soft. Bowel sounds are normal. She exhibits no distension and no mass. There is tenderness. There is no rebound and no guarding.  RUQ tenderness. Negative Murphy's.  Musculoskeletal: She exhibits no edema.  Lymphadenopathy:    She has no cervical adenopathy.  Neurological: She is alert. Coordination normal.  Skin: Skin is warm and dry. No rash noted. She is not diaphoretic. No erythema.  Psychiatric: She has a normal mood and affect. Her behavior is normal.  Nursing note and vitals reviewed.   ED Course  Procedures  Labs Review Labs Reviewed  COMPREHENSIVE METABOLIC PANEL - Abnormal; Notable for the following:    CO2 20 (*)    Glucose, Bld 111 (*)    Albumin 3.3 (*)    All other components within normal limits  URINALYSIS, ROUTINE W REFLEX MICROSCOPIC (NOT AT [facility]) - Abnormal; Notable for the following:    Hgb urine dipstick TRACE (*)    All other components within normal limits  URINE MICROSCOPIC-ADD ON - Abnormal; Notable for the following:    Squamous Epithelial / LPF 0-5 (*)    Bacteria, UA FEW (*)    All other components within normal limits  LIPASE, BLOOD  CBC   Imaging Review US Abdomen Limited Ruq  07/17/2015  CLINICAL DATA:  Right upper quadrant and mid abdominal pain. EXAM: US ABDOMEN LIMITED - RIGHT UPPER QUADRANT COMPARISON:  None. FINDINGS: Gallbladder: 1 cm mobile stone lies dependently in the gallbladder. No wall thickening or pericholecystic fluid. Common bile duct: Diameter: 2.7 mm Liver: Increased parenchymal echogenicity consistent with fatty infiltration. No mass or focal lesion. Hepatopetal flow documented in the portal vein. IMPRESSION: 1. Cholelithiasis with no evidence of acute cholecystitis. No acute finding. 2. Hepatic steatosis. Electronically Signed   By: Amie Portland M.D.   On: 07/17/2015 20:20   I have personally reviewed and evaluated these images and lab results as part of my medical decision-making.  MDM   Final diagnoses:  RUQ pain   Patient nontoxic appearing. Tachycardic at 118. HCG, lipase, CMP, CBC unremarkable. UA with trace hemoglobin.  Based on patient history and physical exam, most likely etiologies are cholelithiasis vs MSK. Less likely etiologies include cholecystitis, gastritis/PUD, pancreatitis, MI, GERD.  Right upper quadrant ultrasound demonstrates cholelithiasis with no evidence of acute cholecystitis, as well as hepatic steatosis. Patient feeling better upon reassessment. Patient may be safely discharged home with symptomatic treatment. Discussed reasons for return. Patient to follow-up with primary care provider and general surgery. Patient in understanding and agreement with the plan.   Melton Krebs, PA-C 07/20/15 2110  Pricilla Loveless, MD 07/23/15 [phone]

## 2015-07-17 NOTE — ED Notes (Signed)
Pt was referred to the ED for abd pain x 1 day. Pt c/o epigastric pain with associated tenderness to the RUQ. Pt also endorses having diarrhea that started this AM that appeared like a mucous. Pt denies N&V.

## 2015-07-17 NOTE — Discharge Instructions (Signed)
Ms. Nancy Mayo,  Nice meeting you! Please follow-up with general surgery. Return to the emergency department if you develop increased abdominal pain, fevers, chills, are unable to keep foods down. Feel better soon!  S. Lane Hacker, PA-C Biliary Colic Biliary colic is a pain in the upper abdomen. The pain:  Is usually felt on the right side of the abdomen, but it may also be felt in the center of the abdomen, just below the breastbone (sternum).  May spread back toward the right shoulder blade.  May be steady or irregular.  May be accompanied by nausea and vomiting. Most of the time, the pain goes away in 1-5 hours. After the most intense pain passes, the abdomen may continue to ache mildly for about 24 hours. Biliary colic is caused by a blockage in the bile duct. The bile duct is a pathway that carries bile--a liquid that helps to digest fats--from the gallbladder to the small intestine. Biliary colic usually occurs after eating, when the digestive system demands bile. The pain develops when muscle cells contract forcefully to try to move the blockage so that bile can get by. HOME CARE INSTRUCTIONS  Take medicines only as directed by your health care provider.  Drink enough fluid to keep your urine clear or pale yellow.  Avoid fatty, greasy, and fried foods. These kinds of foods increase your body's demand for bile.  Avoid any foods that make your pain worse.  Avoid overeating.  Avoid having a large meal after fasting. SEEK MEDICAL CARE IF:  You develop a fever.  Your pain gets worse.  You vomit.  You develop nausea that prevents you from eating and drinking. SEEK IMMEDIATE MEDICAL CARE IF:  You suddenly develop a fever and shaking chills.  You develop a yellowish discoloration (jaundice) of:  Skin.  Whites of the eyes.  Mucous membranes.  You have continuous or severe pain that is not relieved with medicines.  You have nausea and vomiting that is not  relieved with medicines.  You develop dizziness or you faint.   This information is not intended to replace advice given to you by your health care provider. Make sure you discuss any questions you have with your health care provider.   Document Released: 10/04/2005 Document Revised: 09/17/2014 Document Reviewed: 02/12/2014 Elsevier Interactive Patient Education [employer]

## 2015-07-17 NOTE — ED Notes (Signed)
Pt  Reports        abd  Pain         Epigastric  In  Nature  Pt   Reports        Pain  Started    Having   The  Symptoms            Yesterday       Pt  Reports       The      Symptoms       Began      Tender  To  The  Touch   Pt     Reports    That    She  Has  No  History   Of      Any    Similar  Episodes          She  Is  Sitting  Upright  On the  Exam table  Speaking  In  Complete  sentances

## 2015-09-29 DIAGNOSIS — Z01419 Encounter for gynecological examination (general) (routine) without abnormal findings: Secondary | ICD-10-CM | POA: Diagnosis not present

## 2015-09-29 DIAGNOSIS — Z6841 Body Mass Index (BMI) 40.0 and over, adult: Secondary | ICD-10-CM | POA: Diagnosis not present

## 2015-09-29 DIAGNOSIS — R6882 Decreased libido: Secondary | ICD-10-CM | POA: Diagnosis not present

## 2015-09-29 DIAGNOSIS — Z1231 Encounter for screening mammogram for malignant neoplasm of breast: Secondary | ICD-10-CM | POA: Diagnosis not present

## 2015-09-29 DIAGNOSIS — Z130 Encounter for screening for diseases of the blood and blood-forming organs and certain disorders involving the immune mechanism: Secondary | ICD-10-CM | POA: Diagnosis not present

## 2015-09-29 DIAGNOSIS — Z1389 Encounter for screening for other disorder: Secondary | ICD-10-CM | POA: Diagnosis not present

## 2015-09-29 DIAGNOSIS — Z3041 Encounter for surveillance of contraceptive pills: Secondary | ICD-10-CM | POA: Diagnosis not present

## 2015-10-31 DIAGNOSIS — E668 Other obesity: Secondary | ICD-10-CM | POA: Diagnosis not present

## 2015-12-08 DIAGNOSIS — R3915 Urgency of urination: Secondary | ICD-10-CM | POA: Diagnosis not present

## 2016-01-01 DIAGNOSIS — E668 Other obesity: Secondary | ICD-10-CM | POA: Diagnosis not present

## 2016-09-30 DIAGNOSIS — Z1151 Encounter for screening for human papillomavirus (HPV): Secondary | ICD-10-CM | POA: Diagnosis not present

## 2016-09-30 DIAGNOSIS — Z130 Encounter for screening for diseases of the blood and blood-forming organs and certain disorders involving the immune mechanism: Secondary | ICD-10-CM | POA: Diagnosis not present

## 2016-09-30 DIAGNOSIS — Z3041 Encounter for surveillance of contraceptive pills: Secondary | ICD-10-CM | POA: Diagnosis not present

## 2016-09-30 DIAGNOSIS — Z1389 Encounter for screening for other disorder: Secondary | ICD-10-CM | POA: Diagnosis not present

## 2016-09-30 DIAGNOSIS — Z1231 Encounter for screening mammogram for malignant neoplasm of breast: Secondary | ICD-10-CM | POA: Diagnosis not present

## 2016-09-30 DIAGNOSIS — Z01419 Encounter for gynecological examination (general) (routine) without abnormal findings: Secondary | ICD-10-CM | POA: Diagnosis not present

## 2016-09-30 DIAGNOSIS — Z6841 Body Mass Index (BMI) 40.0 and over, adult: Secondary | ICD-10-CM | POA: Diagnosis not present

## 2016-09-30 DIAGNOSIS — Z124 Encounter for screening for malignant neoplasm of cervix: Secondary | ICD-10-CM | POA: Diagnosis not present

## 2016-10-01 DIAGNOSIS — Z124 Encounter for screening for malignant neoplasm of cervix: Secondary | ICD-10-CM | POA: Diagnosis not present

## 2017-02-15 DIAGNOSIS — H10021 Other mucopurulent conjunctivitis, right eye: Secondary | ICD-10-CM | POA: Diagnosis not present

## 2017-02-16 DIAGNOSIS — H10021 Other mucopurulent conjunctivitis, right eye: Secondary | ICD-10-CM | POA: Diagnosis not present

## 2017-03-14 DIAGNOSIS — Z131 Encounter for screening for diabetes mellitus: Secondary | ICD-10-CM | POA: Diagnosis not present

## 2017-03-14 DIAGNOSIS — Z0000 Encounter for general adult medical examination without abnormal findings: Secondary | ICD-10-CM | POA: Diagnosis not present

## 2017-03-14 DIAGNOSIS — Z23 Encounter for immunization: Secondary | ICD-10-CM | POA: Diagnosis not present

## 2017-03-14 DIAGNOSIS — Z13220 Encounter for screening for lipoid disorders: Secondary | ICD-10-CM | POA: Diagnosis not present

## 2017-07-07 DIAGNOSIS — J019 Acute sinusitis, unspecified: Secondary | ICD-10-CM | POA: Diagnosis not present

## 2017-10-03 DIAGNOSIS — N898 Other specified noninflammatory disorders of vagina: Secondary | ICD-10-CM | POA: Diagnosis not present

## 2017-10-03 DIAGNOSIS — Z1389 Encounter for screening for other disorder: Secondary | ICD-10-CM | POA: Diagnosis not present

## 2017-10-03 DIAGNOSIS — Z01419 Encounter for gynecological examination (general) (routine) without abnormal findings: Secondary | ICD-10-CM | POA: Diagnosis not present

## 2017-10-03 DIAGNOSIS — Z130 Encounter for screening for diseases of the blood and blood-forming organs and certain disorders involving the immune mechanism: Secondary | ICD-10-CM | POA: Diagnosis not present

## 2017-10-03 DIAGNOSIS — Z6841 Body Mass Index (BMI) 40.0 and over, adult: Secondary | ICD-10-CM | POA: Diagnosis not present

## 2017-10-03 DIAGNOSIS — Z1231 Encounter for screening mammogram for malignant neoplasm of breast: Secondary | ICD-10-CM | POA: Diagnosis not present

## 2017-11-04 DIAGNOSIS — Z3041 Encounter for surveillance of contraceptive pills: Secondary | ICD-10-CM | POA: Diagnosis not present

## 2017-11-04 DIAGNOSIS — E668 Other obesity: Secondary | ICD-10-CM | POA: Diagnosis not present

## 2017-11-14 DIAGNOSIS — R030 Elevated blood-pressure reading, without diagnosis of hypertension: Secondary | ICD-10-CM | POA: Diagnosis not present

## 2018-01-31 DIAGNOSIS — H5712 Ocular pain, left eye: Secondary | ICD-10-CM | POA: Diagnosis not present

## 2018-01-31 DIAGNOSIS — Z111 Encounter for screening for respiratory tuberculosis: Secondary | ICD-10-CM | POA: Diagnosis not present

## 2018-01-31 DIAGNOSIS — H10022 Other mucopurulent conjunctivitis, left eye: Secondary | ICD-10-CM | POA: Diagnosis not present

## 2018-03-14 DIAGNOSIS — F419 Anxiety disorder, unspecified: Secondary | ICD-10-CM | POA: Diagnosis not present

## 2018-03-14 DIAGNOSIS — I10 Essential (primary) hypertension: Secondary | ICD-10-CM | POA: Diagnosis not present

## 2018-09-13 DIAGNOSIS — I10 Essential (primary) hypertension: Secondary | ICD-10-CM | POA: Diagnosis not present

## 2018-09-13 DIAGNOSIS — F419 Anxiety disorder, unspecified: Secondary | ICD-10-CM | POA: Diagnosis not present

## 2018-09-19 DIAGNOSIS — I10 Essential (primary) hypertension: Secondary | ICD-10-CM | POA: Diagnosis not present

## 2018-10-11 DIAGNOSIS — Z1389 Encounter for screening for other disorder: Secondary | ICD-10-CM | POA: Diagnosis not present

## 2018-10-11 DIAGNOSIS — Z1231 Encounter for screening mammogram for malignant neoplasm of breast: Secondary | ICD-10-CM | POA: Diagnosis not present

## 2018-10-11 DIAGNOSIS — Z130 Encounter for screening for diseases of the blood and blood-forming organs and certain disorders involving the immune mechanism: Secondary | ICD-10-CM | POA: Diagnosis not present

## 2018-10-11 DIAGNOSIS — Z01419 Encounter for gynecological examination (general) (routine) without abnormal findings: Secondary | ICD-10-CM | POA: Diagnosis not present

## 2018-10-11 DIAGNOSIS — R3121 Asymptomatic microscopic hematuria: Secondary | ICD-10-CM | POA: Diagnosis not present

## 2018-10-11 DIAGNOSIS — Z6841 Body Mass Index (BMI) 40.0 and over, adult: Secondary | ICD-10-CM | POA: Diagnosis not present

## 2019-03-16 DIAGNOSIS — F419 Anxiety disorder, unspecified: Secondary | ICD-10-CM | POA: Diagnosis not present

## 2019-03-16 DIAGNOSIS — I10 Essential (primary) hypertension: Secondary | ICD-10-CM | POA: Diagnosis not present

## 2019-10-04 DIAGNOSIS — M5432 Sciatica, left side: Secondary | ICD-10-CM | POA: Diagnosis not present

## 2019-10-04 DIAGNOSIS — F419 Anxiety disorder, unspecified: Secondary | ICD-10-CM | POA: Diagnosis not present

## 2019-10-04 DIAGNOSIS — I10 Essential (primary) hypertension: Secondary | ICD-10-CM | POA: Diagnosis not present

## 2019-10-25 DIAGNOSIS — Z1231 Encounter for screening mammogram for malignant neoplasm of breast: Secondary | ICD-10-CM | POA: Diagnosis not present

## 2019-10-25 DIAGNOSIS — Z130 Encounter for screening for diseases of the blood and blood-forming organs and certain disorders involving the immune mechanism: Secondary | ICD-10-CM | POA: Diagnosis not present

## 2019-10-25 DIAGNOSIS — Z01419 Encounter for gynecological examination (general) (routine) without abnormal findings: Secondary | ICD-10-CM | POA: Diagnosis not present

## 2019-10-25 DIAGNOSIS — Z1389 Encounter for screening for other disorder: Secondary | ICD-10-CM | POA: Diagnosis not present

## 2019-10-25 DIAGNOSIS — Z6841 Body Mass Index (BMI) 40.0 and over, adult: Secondary | ICD-10-CM | POA: Diagnosis not present

## 2020-04-08 DIAGNOSIS — F419 Anxiety disorder, unspecified: Secondary | ICD-10-CM | POA: Diagnosis not present

## 2020-04-08 DIAGNOSIS — R232 Flushing: Secondary | ICD-10-CM | POA: Diagnosis not present

## 2020-04-08 DIAGNOSIS — I10 Essential (primary) hypertension: Secondary | ICD-10-CM | POA: Diagnosis not present

## 2020-07-22 DIAGNOSIS — N938 Other specified abnormal uterine and vaginal bleeding: Secondary | ICD-10-CM | POA: Diagnosis not present

## 2020-10-16 DIAGNOSIS — I10 Essential (primary) hypertension: Secondary | ICD-10-CM | POA: Diagnosis not present

## 2020-10-16 DIAGNOSIS — R232 Flushing: Secondary | ICD-10-CM | POA: Diagnosis not present

## 2020-10-16 DIAGNOSIS — F419 Anxiety disorder, unspecified: Secondary | ICD-10-CM | POA: Diagnosis not present

## 2020-11-05 DIAGNOSIS — Z6841 Body Mass Index (BMI) 40.0 and over, adult: Secondary | ICD-10-CM | POA: Diagnosis not present

## 2020-11-05 DIAGNOSIS — Z1389 Encounter for screening for other disorder: Secondary | ICD-10-CM | POA: Diagnosis not present

## 2020-11-05 DIAGNOSIS — Z01419 Encounter for gynecological examination (general) (routine) without abnormal findings: Secondary | ICD-10-CM | POA: Diagnosis not present

## 2020-11-05 DIAGNOSIS — Z130 Encounter for screening for diseases of the blood and blood-forming organs and certain disorders involving the immune mechanism: Secondary | ICD-10-CM | POA: Diagnosis not present

## 2020-11-05 DIAGNOSIS — Z1231 Encounter for screening mammogram for malignant neoplasm of breast: Secondary | ICD-10-CM | POA: Diagnosis not present

## 2021-05-27 DIAGNOSIS — K625 Hemorrhage of anus and rectum: Secondary | ICD-10-CM | POA: Diagnosis not present

## 2021-05-27 DIAGNOSIS — I10 Essential (primary) hypertension: Secondary | ICD-10-CM | POA: Diagnosis not present

## 2021-05-27 DIAGNOSIS — M25511 Pain in right shoulder: Secondary | ICD-10-CM | POA: Diagnosis not present

## 2021-05-27 DIAGNOSIS — F419 Anxiety disorder, unspecified: Secondary | ICD-10-CM | POA: Diagnosis not present

## 2021-08-25 DIAGNOSIS — K625 Hemorrhage of anus and rectum: Secondary | ICD-10-CM | POA: Diagnosis not present

## 2021-09-28 DIAGNOSIS — Z1211 Encounter for screening for malignant neoplasm of colon: Secondary | ICD-10-CM | POA: Diagnosis not present

## 2021-09-28 DIAGNOSIS — K5730 Diverticulosis of large intestine without perforation or abscess without bleeding: Secondary | ICD-10-CM | POA: Diagnosis not present

## 2021-09-28 DIAGNOSIS — D128 Benign neoplasm of rectum: Secondary | ICD-10-CM | POA: Diagnosis not present

## 2021-09-28 DIAGNOSIS — D120 Benign neoplasm of cecum: Secondary | ICD-10-CM | POA: Diagnosis not present

## 2021-11-10 DIAGNOSIS — Z1151 Encounter for screening for human papillomavirus (HPV): Secondary | ICD-10-CM | POA: Diagnosis not present

## 2021-11-10 DIAGNOSIS — Z130 Encounter for screening for diseases of the blood and blood-forming organs and certain disorders involving the immune mechanism: Secondary | ICD-10-CM | POA: Diagnosis not present

## 2021-11-10 DIAGNOSIS — Z1231 Encounter for screening mammogram for malignant neoplasm of breast: Secondary | ICD-10-CM | POA: Diagnosis not present

## 2021-11-10 DIAGNOSIS — Z124 Encounter for screening for malignant neoplasm of cervix: Secondary | ICD-10-CM | POA: Diagnosis not present

## 2021-11-10 DIAGNOSIS — Z0142 Encounter for cervical smear to confirm findings of recent normal smear following initial abnormal smear: Secondary | ICD-10-CM | POA: Diagnosis not present

## 2021-11-10 DIAGNOSIS — I10 Essential (primary) hypertension: Secondary | ICD-10-CM | POA: Diagnosis not present

## 2021-11-10 DIAGNOSIS — Z01419 Encounter for gynecological examination (general) (routine) without abnormal findings: Secondary | ICD-10-CM | POA: Diagnosis not present

## 2021-11-25 DIAGNOSIS — Z13220 Encounter for screening for lipoid disorders: Secondary | ICD-10-CM | POA: Diagnosis not present

## 2021-11-25 DIAGNOSIS — F419 Anxiety disorder, unspecified: Secondary | ICD-10-CM | POA: Diagnosis not present

## 2021-11-25 DIAGNOSIS — R430 Anosmia: Secondary | ICD-10-CM | POA: Diagnosis not present

## 2021-11-25 DIAGNOSIS — I10 Essential (primary) hypertension: Secondary | ICD-10-CM | POA: Diagnosis not present

## 2022-06-07 DIAGNOSIS — B029 Zoster without complications: Secondary | ICD-10-CM | POA: Diagnosis not present

## 2022-06-07 DIAGNOSIS — Z6841 Body Mass Index (BMI) 40.0 and over, adult: Secondary | ICD-10-CM | POA: Diagnosis not present

## 2022-06-07 DIAGNOSIS — B349 Viral infection, unspecified: Secondary | ICD-10-CM | POA: Diagnosis not present

## 2022-06-07 DIAGNOSIS — R030 Elevated blood-pressure reading, without diagnosis of hypertension: Secondary | ICD-10-CM | POA: Diagnosis not present

## 2022-06-15 DIAGNOSIS — E8889 Other specified metabolic disorders: Secondary | ICD-10-CM | POA: Diagnosis not present

## 2022-06-15 DIAGNOSIS — M2550 Pain in unspecified joint: Secondary | ICD-10-CM | POA: Diagnosis not present

## 2022-06-15 DIAGNOSIS — E559 Vitamin D deficiency, unspecified: Secondary | ICD-10-CM | POA: Diagnosis not present

## 2022-06-15 DIAGNOSIS — F418 Other specified anxiety disorders: Secondary | ICD-10-CM | POA: Diagnosis not present

## 2022-06-15 DIAGNOSIS — M5432 Sciatica, left side: Secondary | ICD-10-CM | POA: Diagnosis not present

## 2022-06-15 DIAGNOSIS — I10 Essential (primary) hypertension: Secondary | ICD-10-CM | POA: Diagnosis not present

## 2022-06-15 DIAGNOSIS — F5081 Binge eating disorder: Secondary | ICD-10-CM | POA: Diagnosis not present

## 2022-06-15 DIAGNOSIS — Z8639 Personal history of other endocrine, nutritional and metabolic disease: Secondary | ICD-10-CM | POA: Diagnosis not present

## 2022-06-30 DIAGNOSIS — Z6841 Body Mass Index (BMI) 40.0 and over, adult: Secondary | ICD-10-CM | POA: Diagnosis not present

## 2022-06-30 DIAGNOSIS — F5081 Binge eating disorder: Secondary | ICD-10-CM | POA: Diagnosis not present

## 2022-07-14 DIAGNOSIS — R519 Headache, unspecified: Secondary | ICD-10-CM | POA: Diagnosis not present

## 2022-07-14 DIAGNOSIS — Z6841 Body Mass Index (BMI) 40.0 and over, adult: Secondary | ICD-10-CM | POA: Diagnosis not present

## 2022-07-14 DIAGNOSIS — F5081 Binge eating disorder: Secondary | ICD-10-CM | POA: Diagnosis not present

## 2022-08-02 DIAGNOSIS — F5081 Binge eating disorder: Secondary | ICD-10-CM | POA: Diagnosis not present

## 2022-08-02 DIAGNOSIS — E65 Localized adiposity: Secondary | ICD-10-CM | POA: Diagnosis not present

## 2022-08-02 DIAGNOSIS — K760 Fatty (change of) liver, not elsewhere classified: Secondary | ICD-10-CM | POA: Diagnosis not present

## 2022-08-02 DIAGNOSIS — I10 Essential (primary) hypertension: Secondary | ICD-10-CM | POA: Diagnosis not present

## 2022-09-08 DIAGNOSIS — K760 Fatty (change of) liver, not elsewhere classified: Secondary | ICD-10-CM | POA: Diagnosis not present

## 2022-09-08 DIAGNOSIS — I10 Essential (primary) hypertension: Secondary | ICD-10-CM | POA: Diagnosis not present

## 2022-09-08 DIAGNOSIS — E65 Localized adiposity: Secondary | ICD-10-CM | POA: Diagnosis not present

## 2022-09-08 DIAGNOSIS — F5081 Binge eating disorder: Secondary | ICD-10-CM | POA: Diagnosis not present

## 2022-10-07 DIAGNOSIS — I10 Essential (primary) hypertension: Secondary | ICD-10-CM | POA: Diagnosis not present

## 2022-10-07 DIAGNOSIS — E65 Localized adiposity: Secondary | ICD-10-CM | POA: Diagnosis not present

## 2022-10-07 DIAGNOSIS — K760 Fatty (change of) liver, not elsewhere classified: Secondary | ICD-10-CM | POA: Diagnosis not present

## 2022-10-07 DIAGNOSIS — F5081 Binge eating disorder: Secondary | ICD-10-CM | POA: Diagnosis not present

## 2022-11-08 DIAGNOSIS — E65 Localized adiposity: Secondary | ICD-10-CM | POA: Diagnosis not present

## 2022-11-08 DIAGNOSIS — K760 Fatty (change of) liver, not elsewhere classified: Secondary | ICD-10-CM | POA: Diagnosis not present

## 2022-11-08 DIAGNOSIS — F5081 Binge eating disorder: Secondary | ICD-10-CM | POA: Diagnosis not present

## 2022-11-08 DIAGNOSIS — I10 Essential (primary) hypertension: Secondary | ICD-10-CM | POA: Diagnosis not present

## 2022-11-15 DIAGNOSIS — Z1231 Encounter for screening mammogram for malignant neoplasm of breast: Secondary | ICD-10-CM | POA: Diagnosis not present

## 2022-11-15 DIAGNOSIS — Z01419 Encounter for gynecological examination (general) (routine) without abnormal findings: Secondary | ICD-10-CM | POA: Diagnosis not present

## 2022-12-09 DIAGNOSIS — K760 Fatty (change of) liver, not elsewhere classified: Secondary | ICD-10-CM | POA: Diagnosis not present

## 2022-12-09 DIAGNOSIS — E65 Localized adiposity: Secondary | ICD-10-CM | POA: Diagnosis not present

## 2022-12-09 DIAGNOSIS — F5081 Binge eating disorder: Secondary | ICD-10-CM | POA: Diagnosis not present

## 2022-12-09 DIAGNOSIS — I10 Essential (primary) hypertension: Secondary | ICD-10-CM | POA: Diagnosis not present

## 2023-01-13 DIAGNOSIS — F5081 Binge eating disorder: Secondary | ICD-10-CM | POA: Diagnosis not present

## 2023-01-13 DIAGNOSIS — K760 Fatty (change of) liver, not elsewhere classified: Secondary | ICD-10-CM | POA: Diagnosis not present

## 2023-01-13 DIAGNOSIS — E65 Localized adiposity: Secondary | ICD-10-CM | POA: Diagnosis not present

## 2023-01-13 DIAGNOSIS — I10 Essential (primary) hypertension: Secondary | ICD-10-CM | POA: Diagnosis not present

## 2023-02-17 DIAGNOSIS — E65 Localized adiposity: Secondary | ICD-10-CM | POA: Diagnosis not present

## 2023-02-17 DIAGNOSIS — F5081 Binge eating disorder, mild: Secondary | ICD-10-CM | POA: Diagnosis not present

## 2023-02-17 DIAGNOSIS — I10 Essential (primary) hypertension: Secondary | ICD-10-CM | POA: Diagnosis not present

## 2023-02-17 DIAGNOSIS — K760 Fatty (change of) liver, not elsewhere classified: Secondary | ICD-10-CM | POA: Diagnosis not present

## 2023-03-09 DIAGNOSIS — M5416 Radiculopathy, lumbar region: Secondary | ICD-10-CM | POA: Diagnosis not present

## 2023-03-09 DIAGNOSIS — N10 Acute tubulo-interstitial nephritis: Secondary | ICD-10-CM | POA: Diagnosis not present

## 2023-03-09 DIAGNOSIS — E669 Obesity, unspecified: Secondary | ICD-10-CM | POA: Diagnosis not present

## 2023-03-09 DIAGNOSIS — Z6839 Body mass index (BMI) 39.0-39.9, adult: Secondary | ICD-10-CM | POA: Diagnosis not present

## 2023-03-21 DIAGNOSIS — E65 Localized adiposity: Secondary | ICD-10-CM | POA: Diagnosis not present

## 2023-03-21 DIAGNOSIS — I10 Essential (primary) hypertension: Secondary | ICD-10-CM | POA: Diagnosis not present

## 2023-03-21 DIAGNOSIS — K760 Fatty (change of) liver, not elsewhere classified: Secondary | ICD-10-CM | POA: Diagnosis not present

## 2023-03-30 DIAGNOSIS — M545 Low back pain, unspecified: Secondary | ICD-10-CM | POA: Diagnosis not present

## 2023-03-30 DIAGNOSIS — M79604 Pain in right leg: Secondary | ICD-10-CM | POA: Diagnosis not present

## 2023-05-04 DIAGNOSIS — M545 Low back pain, unspecified: Secondary | ICD-10-CM | POA: Diagnosis not present

## 2023-06-01 DIAGNOSIS — L255 Unspecified contact dermatitis due to plants, except food: Secondary | ICD-10-CM | POA: Diagnosis not present

## 2023-07-07 DIAGNOSIS — Z131 Encounter for screening for diabetes mellitus: Secondary | ICD-10-CM | POA: Diagnosis not present

## 2023-07-07 DIAGNOSIS — Z13220 Encounter for screening for lipoid disorders: Secondary | ICD-10-CM | POA: Diagnosis not present

## 2023-07-07 DIAGNOSIS — Z0000 Encounter for general adult medical examination without abnormal findings: Secondary | ICD-10-CM | POA: Diagnosis not present

## 2023-07-07 DIAGNOSIS — F411 Generalized anxiety disorder: Secondary | ICD-10-CM | POA: Diagnosis not present

## 2023-07-07 DIAGNOSIS — Z23 Encounter for immunization: Secondary | ICD-10-CM | POA: Diagnosis not present

## 2023-11-04 DIAGNOSIS — Z23 Encounter for immunization: Secondary | ICD-10-CM | POA: Diagnosis not present

## 2023-11-16 DIAGNOSIS — N926 Irregular menstruation, unspecified: Secondary | ICD-10-CM | POA: Diagnosis not present

## 2023-11-16 DIAGNOSIS — N898 Other specified noninflammatory disorders of vagina: Secondary | ICD-10-CM | POA: Diagnosis not present

## 2023-11-16 DIAGNOSIS — Z1231 Encounter for screening mammogram for malignant neoplasm of breast: Secondary | ICD-10-CM | POA: Diagnosis not present

## 2023-11-16 DIAGNOSIS — Z01419 Encounter for gynecological examination (general) (routine) without abnormal findings: Secondary | ICD-10-CM | POA: Diagnosis not present
